# Patient Record
Sex: Female | Born: 1965 | Race: White | Hispanic: No | Smoking: Former smoker
Health system: Southern US, Community
[De-identification: ages and names within clinical notes are randomized; demographics above are authoritative.]

## PROBLEM LIST (undated history)

## (undated) DIAGNOSIS — R112 Nausea with vomiting, unspecified: Secondary | ICD-10-CM

## (undated) DIAGNOSIS — K5090 Crohn's disease, unspecified, without complications: Secondary | ICD-10-CM

## (undated) DIAGNOSIS — E785 Hyperlipidemia, unspecified: Secondary | ICD-10-CM

## (undated) DIAGNOSIS — M719 Bursopathy, unspecified: Secondary | ICD-10-CM

## (undated) DIAGNOSIS — K219 Gastro-esophageal reflux disease without esophagitis: Secondary | ICD-10-CM

## (undated) DIAGNOSIS — E538 Deficiency of other specified B group vitamins: Secondary | ICD-10-CM

## (undated) DIAGNOSIS — T8859XA Other complications of anesthesia, initial encounter: Secondary | ICD-10-CM

## (undated) DIAGNOSIS — E039 Hypothyroidism, unspecified: Secondary | ICD-10-CM

## (undated) DIAGNOSIS — T4145XA Adverse effect of unspecified anesthetic, initial encounter: Secondary | ICD-10-CM

## (undated) DIAGNOSIS — Z973 Presence of spectacles and contact lenses: Secondary | ICD-10-CM

## (undated) DIAGNOSIS — C801 Malignant (primary) neoplasm, unspecified: Secondary | ICD-10-CM

## (undated) DIAGNOSIS — M1990 Unspecified osteoarthritis, unspecified site: Secondary | ICD-10-CM

## (undated) DIAGNOSIS — B009 Herpesviral infection, unspecified: Secondary | ICD-10-CM

## (undated) DIAGNOSIS — D649 Anemia, unspecified: Secondary | ICD-10-CM

## (undated) DIAGNOSIS — F419 Anxiety disorder, unspecified: Secondary | ICD-10-CM

## (undated) DIAGNOSIS — G4730 Sleep apnea, unspecified: Secondary | ICD-10-CM

## (undated) DIAGNOSIS — Z9889 Other specified postprocedural states: Secondary | ICD-10-CM

## (undated) DIAGNOSIS — N809 Endometriosis, unspecified: Secondary | ICD-10-CM

## (undated) DIAGNOSIS — F1010 Alcohol abuse, uncomplicated: Secondary | ICD-10-CM

## (undated) HISTORY — PX: ENDOMETRIAL BIOPSY: SHX622

## (undated) HISTORY — PX: TONSILLECTOMY: SUR1361

## (undated) HISTORY — PX: CYSTECTOMY: SUR359

## (undated) HISTORY — PX: EXPLORATORY LAPAROTOMY: SUR591

## (undated) HISTORY — PX: UPPER GI ENDOSCOPY: SHX6162

## (undated) HISTORY — PX: CERVICAL BIOPSY  W/ LOOP ELECTRODE EXCISION: SUR135

## (undated) HISTORY — PX: KNEE ARTHROSCOPY: SUR90

## (undated) HISTORY — PX: DILATION AND CURETTAGE OF UTERUS: SHX78

---

## 2011-06-06 HISTORY — PX: ESSURE TUBAL LIGATION: SUR464

## 2018-06-10 ENCOUNTER — Other Ambulatory Visit: Payer: Self-pay | Admitting: Otolaryngology

## 2018-07-02 NOTE — Pre-Procedure Instructions (Signed)
Tonya Chambers  07/02/2018      [facility] [phone] - [place], [place] AT [place] [address] [place] [address] Phone: [phone] Fax: [phone]    Your procedure is scheduled on Wednesday February 5th.  Report to [facility] Admitting at 8:30 A.M.  Call this number if you have problems the morning of surgery:  [phone]   Remember:  Do not eat or drink after midnight.      Take these medicines the morning of surgery with A SIP OF WATER  acetaminophen (TYLENOL)-if needed DULoxetine (CYMBALTA) levothyroxine (SYNTHROID, LEVOTHROID) omeprazole (PRILOSEC) Polyethyl Glycol-Propyl Glycol (LUBRICANT EYE DROPS)   7 days prior to surgery STOP taking any Aspirin(unless otherwise instructed by your surgeon), Aleve, Naproxen, Ibuprofen, Motrin, Advil, Goody's, BC's, all herbal medications, fish oil, and all vitamins   Do not wear jewelry, make-up or nail polish.  Do not wear lotions, powders, or perfumes, or deodorant.  Do not shave 48 hours prior to surgery.   Do not bring valuables to the hospital.  [facility] is not responsible for any belongings or valuables.  Contacts, dentures or bridgework may not be worn into surgery.  Leave your suitcase in the car.  After surgery it may be brought to your room.  For patients admitted to the hospital, discharge time will be determined by your treatment team.  Patients discharged the day of surgery will not be allowed to drive home.   [facility]- Preparing For Surgery  Before surgery, you can play an important role. Because skin is not sterile, your skin needs to be as free of germs as possible. You can reduce the number of germs on your skin by washing with CHG (chlorahexidine gluconate) Soap before surgery.  CHG is an antiseptic cleaner which kills germs and bonds with the skin to continue killing germs even after washing.    Oral Hygiene is  also important to reduce your risk of infection.  Remember - BRUSH YOUR TEETH THE MORNING OF SURGERY WITH YOUR REGULAR TOOTHPASTE  Please do not use if you have an allergy to CHG or antibacterial soaps. If your skin becomes reddened/irritated stop using the CHG.  Do not shave (including legs and underarms) for at least 48 hours prior to first CHG shower. It is OK to shave your face.  Please follow these instructions carefully.   1. Shower the NIGHT BEFORE SURGERY and the MORNING OF SURGERY with CHG.   2. If you chose to wash your hair, wash your hair first as usual with your normal shampoo.  3. After you shampoo, rinse your hair and body thoroughly to remove the shampoo.  4. Use CHG as you would any other liquid soap. You can apply CHG directly to the skin and wash gently with a scrungie or a clean washcloth.   5. Apply the CHG Soap to your body ONLY FROM THE NECK DOWN.  Do not use on open wounds or open sores. Avoid contact with your eyes, ears, mouth and genitals (private parts). Wash Face and genitals (private parts)  with your normal soap.  6. Wash thoroughly, paying special attention to the area where your surgery will be performed.  7. Thoroughly rinse your body with warm water from the neck down.  8. DO NOT shower/wash with your normal soap after using and rinsing off the CHG Soap.  9. Pat yourself dry with a CLEAN TOWEL.  10.  Wear CLEAN PAJAMAS to bed the night before surgery, wear comfortable clothes the morning of surgery  11. Place CLEAN SHEETS on your bed the night of your first shower and DO NOT SLEEP WITH PETS.    Day of Surgery: Shower as stated above. Do not apply any deodorants/lotions.  Please wear clean clothes to the hospital/surgery center.   Remember to brush your teeth WITH YOUR REGULAR TOOTHPASTE.   Please read over the following fact sheets that you were given.

## 2018-07-03 ENCOUNTER — Encounter (HOSPITAL_COMMUNITY): Payer: Self-pay

## 2018-07-03 ENCOUNTER — Encounter (HOSPITAL_COMMUNITY)
Admission: RE | Admit: 2018-07-03 | Discharge: 2018-07-03 | Disposition: A | Discharge status: Home / Self Care | Payer: BLUE CROSS/BLUE SHIELD | Source: Ambulatory Visit | Attending: Otolaryngology | Admitting: Otolaryngology

## 2018-07-03 ENCOUNTER — Other Ambulatory Visit: Payer: Self-pay

## 2018-07-03 DIAGNOSIS — Z01812 Encounter for preprocedural laboratory examination: Secondary | ICD-10-CM | POA: Diagnosis present

## 2018-07-03 HISTORY — DX: Deficiency of other specified B group vitamins: E53.8

## 2018-07-03 HISTORY — DX: Sleep apnea, unspecified: G47.30

## 2018-07-03 HISTORY — DX: Other complications of anesthesia, initial encounter: T88.59XA

## 2018-07-03 HISTORY — DX: Alcohol abuse, uncomplicated: F10.10

## 2018-07-03 HISTORY — DX: Endometriosis, unspecified: N80.9

## 2018-07-03 HISTORY — DX: Anemia, unspecified: D64.9

## 2018-07-03 HISTORY — DX: Other specified postprocedural states: Z98.890

## 2018-07-03 HISTORY — DX: Anxiety disorder, unspecified: F41.9

## 2018-07-03 HISTORY — DX: Hypothyroidism, unspecified: E03.9

## 2018-07-03 HISTORY — DX: Malignant (primary) neoplasm, unspecified: C80.1

## 2018-07-03 HISTORY — DX: Other specified postprocedural states: R11.2

## 2018-07-03 HISTORY — DX: Hyperlipidemia, unspecified: E78.5

## 2018-07-03 HISTORY — DX: Crohn's disease, unspecified, without complications: K50.90

## 2018-07-03 HISTORY — DX: Herpesviral infection, unspecified: B00.9

## 2018-07-03 HISTORY — DX: Adverse effect of unspecified anesthetic, initial encounter: T41.45XA

## 2018-07-03 HISTORY — DX: Gastro-esophageal reflux disease without esophagitis: K21.9

## 2018-07-03 HISTORY — DX: Unspecified osteoarthritis, unspecified site: M19.90

## 2018-07-03 HISTORY — DX: Bursopathy, unspecified: M71.9

## 2018-07-03 LAB — COMPREHENSIVE METABOLIC PANEL
ALT: 20 U/L (ref 0–44)
AST: 24 U/L (ref 15–41)
Albumin: 4.2 g/dL (ref 3.5–5.0)
Alkaline Phosphatase: 53 U/L (ref 38–126)
Anion gap: 12 (ref 5–15)
BUN: 8 mg/dL (ref 6–20)
CO2: 23 mmol/L (ref 22–32)
Calcium: 9.2 mg/dL (ref 8.9–10.3)
Chloride: 104 mmol/L (ref 98–111)
Creatinine, Ser: 0.64 mg/dL (ref 0.44–1.00)
GFR calc Af Amer: >60 mL/min (ref >60)
GFR calc non Af Amer: >60 mL/min (ref >60)
Glucose, Bld: 107 mg/dL — ABNORMAL HIGH (ref 70–99)
Potassium: 4.3 mmol/L (ref 3.5–5.1)
Sodium: 139 mmol/L (ref 135–145)
Total Bilirubin: 0.6 mg/dL (ref 0.3–1.2)
Total Protein: 7.1 g/dL (ref 6.5–8.1)

## 2018-07-03 LAB — CBC
HCT: 40.5 % (ref 36.0–46.0)
HEMOGLOBIN: 13.6 g/dL (ref 12.0–15.0)
MCH: 30 pg (ref 26.0–34.0)
MCHC: 33.6 g/dL (ref 30.0–36.0)
MCV: 89.2 fL (ref 80.0–100.0)
Platelets: 471 10*3/uL — ABNORMAL HIGH (ref 150–400)
RBC: 4.54 MIL/uL (ref 3.87–5.11)
RDW: 14 % (ref 11.5–15.5)
WBC: 12.1 10*3/uL — ABNORMAL HIGH (ref 4.0–10.5)
nRBC: 0 % (ref 0.0–0.2)

## 2018-07-03 NOTE — Progress Notes (Signed)
PCP - Wayland Salinas, [facility] Cardiologist - denies  Chest x-ray -N/A EKG - N/A Stress Test - denies ECHO - denies Cardiac Cath - denies  Sleep Study - diagnosed with OSA CPAP - has CPAP but does not use   Aspirin Instructions: Patient instructed to hold all Aspirin, NSAID's, herbal medications, fish oil and vitamins 7 days prior to surgery.   Anesthesia review:   Patient denies shortness of breath, fever, cough and chest pain at PAT appointment   Patient verbalized understanding of instructions that were given to them at the PAT appointment. Patient was also instructed that they will need to review over the PAT instructions again at home before surgery.

## 2018-08-20 ENCOUNTER — Encounter (HOSPITAL_COMMUNITY): Payer: Self-pay | Admitting: *Deleted

## 2018-08-20 ENCOUNTER — Other Ambulatory Visit: Payer: Self-pay

## 2018-08-20 NOTE — Progress Notes (Signed)
Spoke with patient regarding her pre-op instructions.  Patient denies chest pain, SOB or DM.  Patient had a pre-op appt in 06/2018 and still had CHG soap at home.  Patient will shower with CHG soap tonight and tomorrow morning.

## 2018-08-21 ENCOUNTER — Ambulatory Visit (HOSPITAL_COMMUNITY): Payer: BLUE CROSS/BLUE SHIELD | Admitting: Anesthesiology

## 2018-08-21 ENCOUNTER — Ambulatory Visit (HOSPITAL_COMMUNITY)
Admission: RE | Admit: 2018-08-21 | Discharge: 2018-08-21 | Disposition: A | Discharge status: Home / Self Care | Payer: BLUE CROSS/BLUE SHIELD | Attending: Otolaryngology | Admitting: Otolaryngology

## 2018-08-21 ENCOUNTER — Encounter (HOSPITAL_COMMUNITY): Payer: Self-pay

## 2018-08-21 ENCOUNTER — Encounter (HOSPITAL_COMMUNITY)
Admission: RE | Disposition: A | Discharge status: Home / Self Care | Payer: Self-pay | Source: Home / Self Care | Attending: Otolaryngology

## 2018-08-21 ENCOUNTER — Other Ambulatory Visit: Payer: Self-pay

## 2018-08-21 DIAGNOSIS — E039 Hypothyroidism, unspecified: Secondary | ICD-10-CM | POA: Insufficient documentation

## 2018-08-21 DIAGNOSIS — M1990 Unspecified osteoarthritis, unspecified site: Secondary | ICD-10-CM | POA: Diagnosis not present

## 2018-08-21 DIAGNOSIS — F419 Anxiety disorder, unspecified: Secondary | ICD-10-CM | POA: Insufficient documentation

## 2018-08-21 DIAGNOSIS — J342 Deviated nasal septum: Secondary | ICD-10-CM | POA: Insufficient documentation

## 2018-08-21 DIAGNOSIS — E785 Hyperlipidemia, unspecified: Secondary | ICD-10-CM | POA: Diagnosis not present

## 2018-08-21 DIAGNOSIS — Z79899 Other long term (current) drug therapy: Secondary | ICD-10-CM | POA: Diagnosis not present

## 2018-08-21 DIAGNOSIS — Z87891 Personal history of nicotine dependence: Secondary | ICD-10-CM | POA: Insufficient documentation

## 2018-08-21 DIAGNOSIS — K5090 Crohn's disease, unspecified, without complications: Secondary | ICD-10-CM | POA: Diagnosis not present

## 2018-08-21 DIAGNOSIS — K219 Gastro-esophageal reflux disease without esophagitis: Secondary | ICD-10-CM | POA: Diagnosis not present

## 2018-08-21 DIAGNOSIS — G4733 Obstructive sleep apnea (adult) (pediatric): Secondary | ICD-10-CM | POA: Insufficient documentation

## 2018-08-21 DIAGNOSIS — J343 Hypertrophy of nasal turbinates: Secondary | ICD-10-CM | POA: Diagnosis not present

## 2018-08-21 HISTORY — PX: NASAL SEPTOPLASTY W/ TURBINOPLASTY: SHX2070

## 2018-08-21 HISTORY — PX: DRUG INDUCED ENDOSCOPY: SHX6808

## 2018-08-21 HISTORY — DX: Presence of spectacles and contact lenses: Z97.3

## 2018-08-21 LAB — COMPREHENSIVE METABOLIC PANEL
ALBUMIN: 3.6 g/dL (ref 3.5–5.0)
ALT: 15 U/L (ref 0–44)
AST: 16 U/L (ref 15–41)
Alkaline Phosphatase: 49 U/L (ref 38–126)
Anion gap: 7 (ref 5–15)
BUN: 11 mg/dL (ref 6–20)
CO2: 22 mmol/L (ref 22–32)
Calcium: 8.5 mg/dL — ABNORMAL LOW (ref 8.9–10.3)
Chloride: 109 mmol/L (ref 98–111)
Creatinine, Ser: 0.56 mg/dL (ref 0.44–1.00)
GFR calc Af Amer: >60 mL/min (ref >60)
GFR calc non Af Amer: >60 mL/min (ref >60)
Glucose, Bld: 98 mg/dL (ref 70–99)
Potassium: 3.5 mmol/L (ref 3.5–5.1)
Sodium: 138 mmol/L (ref 135–145)
Total Bilirubin: 0.7 mg/dL (ref 0.3–1.2)
Total Protein: 5.8 g/dL — ABNORMAL LOW (ref 6.5–8.1)

## 2018-08-21 LAB — CBC
HCT: 36.5 % (ref 36.0–46.0)
Hemoglobin: 11.9 g/dL — ABNORMAL LOW (ref 12.0–15.0)
MCH: 28.8 pg (ref 26.0–34.0)
MCHC: 32.6 g/dL (ref 30.0–36.0)
MCV: 88.4 fL (ref 80.0–100.0)
Platelets: 257 10*3/uL (ref 150–400)
RBC: 4.13 MIL/uL (ref 3.87–5.11)
RDW: 14.4 % (ref 11.5–15.5)
WBC: 9.6 10*3/uL (ref 4.0–10.5)
nRBC: 0 % (ref 0.0–0.2)

## 2018-08-21 SURGERY — SEPTOPLASTY, NOSE, WITH NASAL TURBINATE REDUCTION
Anesthesia: General | Site: Throat

## 2018-08-21 MED ORDER — PROPOFOL 500 MG/50ML IV EMUL
INTRAVENOUS | Status: DC | PRN
  Administered 2018-08-21: 50 ug/kg/min via INTRAVENOUS

## 2018-08-21 MED ORDER — MUPIROCIN CALCIUM 2 % EX CREA
TOPICAL_CREAM | CUTANEOUS | Status: AC
  Filled 2018-08-21: qty 15

## 2018-08-21 MED ORDER — SUGAMMADEX SODIUM 200 MG/2ML IV SOLN
INTRAVENOUS | Status: DC | PRN
  Administered 2018-08-21: 200 mg via INTRAVENOUS

## 2018-08-21 MED ORDER — OXYCODONE HCL 5 MG PO TABS: 5.0000 mg | ORAL_TABLET | Freq: Once | ORAL | Status: DC | PRN

## 2018-08-21 MED ORDER — FENTANYL CITRATE (PF) 250 MCG/5ML IJ SOLN
INTRAMUSCULAR | Status: DC | PRN
  Administered 2018-08-21 (×2): 50 ug via INTRAVENOUS

## 2018-08-21 MED ORDER — FENTANYL CITRATE (PF) 250 MCG/5ML IJ SOLN
INTRAMUSCULAR | Status: AC
  Filled 2018-08-21: qty 5

## 2018-08-21 MED ORDER — LIDOCAINE-EPINEPHRINE 1 %-1:100000 IJ SOLN
INTRAMUSCULAR | Status: AC
  Filled 2018-08-21: qty 1

## 2018-08-21 MED ORDER — ONDANSETRON HCL 4 MG/2ML IJ SOLN
INTRAMUSCULAR | Status: DC | PRN
  Administered 2018-08-21: 4 mg via INTRAVENOUS

## 2018-08-21 MED ORDER — SODIUM CHLORIDE 0.9 % IV SOLN
INTRAVENOUS | Status: DC | PRN
  Administered 2018-08-21: 25 ug/min via INTRAVENOUS

## 2018-08-21 MED ORDER — CEPHALEXIN 500 MG PO CAPS: 500.0000 mg | ORAL_CAPSULE | Freq: Three times a day (TID) | ORAL | 1 refills | Status: AC

## 2018-08-21 MED ORDER — CHLORHEXIDINE GLUCONATE CLOTH 2 % EX PADS: 6.0000 | MEDICATED_PAD | Freq: Once | CUTANEOUS | Status: DC

## 2018-08-21 MED ORDER — ROCURONIUM BROMIDE 10 MG/ML (PF) SYRINGE
PREFILLED_SYRINGE | INTRAVENOUS | Status: DC | PRN
  Administered 2018-08-21: 50 mg via INTRAVENOUS

## 2018-08-21 MED ORDER — DEXAMETHASONE SODIUM PHOSPHATE 10 MG/ML IJ SOLN
10.0000 mg | Freq: Once | INTRAMUSCULAR | Status: AC
  Administered 2018-08-21: 10 mg via INTRAVENOUS
  Filled 2018-08-21: qty 1

## 2018-08-21 MED ORDER — FENTANYL CITRATE (PF) 100 MCG/2ML IJ SOLN: 25.0000 ug | INTRAMUSCULAR | Status: DC | PRN

## 2018-08-21 MED ORDER — SCOPOLAMINE 1 MG/3DAYS TD PT72
MEDICATED_PATCH | TRANSDERMAL | Status: AC
  Filled 2018-08-21: qty 1

## 2018-08-21 MED ORDER — LACTATED RINGERS IV SOLN
INTRAVENOUS | Status: DC
  Administered 2018-08-21: 10:00:00 via INTRAVENOUS

## 2018-08-21 MED ORDER — LIDOCAINE 2% (20 MG/ML) 5 ML SYRINGE
INTRAMUSCULAR | Status: DC | PRN
  Administered 2018-08-21: 40 mg via INTRAVENOUS

## 2018-08-21 MED ORDER — PROPOFOL 10 MG/ML IV BOLUS
INTRAVENOUS | Status: AC
  Filled 2018-08-21: qty 40

## 2018-08-21 MED ORDER — OXYMETAZOLINE HCL 0.05 % NA SOLN
NASAL | Status: AC
  Filled 2018-08-21: qty 30

## 2018-08-21 MED ORDER — 0.9 % SODIUM CHLORIDE (POUR BTL) OPTIME
TOPICAL | Status: DC | PRN
  Administered 2018-08-21: 1000 mL

## 2018-08-21 MED ORDER — CEFAZOLIN SODIUM-DEXTROSE 2-4 GM/100ML-% IV SOLN
2.0000 g | INTRAVENOUS | Status: AC
  Administered 2018-08-21: 2 g via INTRAVENOUS
  Filled 2018-08-21: qty 100

## 2018-08-21 MED ORDER — MUPIROCIN 2 % EX OINT
TOPICAL_OINTMENT | CUTANEOUS | Status: DC | PRN
  Administered 2018-08-21: 1 via NASAL

## 2018-08-21 MED ORDER — MIDAZOLAM HCL 2 MG/2ML IJ SOLN
INTRAMUSCULAR | Status: AC
  Filled 2018-08-21: qty 2

## 2018-08-21 MED ORDER — ONDANSETRON HCL 4 MG/2ML IJ SOLN: 4.0000 mg | Freq: Once | INTRAMUSCULAR | Status: DC | PRN

## 2018-08-21 MED ORDER — LIDOCAINE-EPINEPHRINE 1 %-1:100000 IJ SOLN
INTRAMUSCULAR | Status: DC | PRN
  Administered 2018-08-21: 6 mL

## 2018-08-21 MED ORDER — OXYMETAZOLINE HCL 0.05 % NA SOLN
1.0000 | Freq: Two times a day (BID) | NASAL | Status: DC
  Administered 2018-08-21: 2 via NASAL
  Filled 2018-08-21: qty 30

## 2018-08-21 MED ORDER — OXYMETAZOLINE HCL 0.05 % NA SOLN
NASAL | Status: DC | PRN
  Administered 2018-08-21: 1

## 2018-08-21 MED ORDER — MUPIROCIN 2 % EX OINT
TOPICAL_OINTMENT | CUTANEOUS | Status: AC
  Filled 2018-08-21: qty 22

## 2018-08-21 MED ORDER — PROPOFOL 10 MG/ML IV BOLUS
INTRAVENOUS | Status: DC | PRN
  Administered 2018-08-21: 20 mg via INTRAVENOUS
  Administered 2018-08-21: 160 mg via INTRAVENOUS
  Administered 2018-08-21: 20 mg via INTRAVENOUS

## 2018-08-21 MED ORDER — MIDAZOLAM HCL 2 MG/2ML IJ SOLN
INTRAMUSCULAR | Status: DC | PRN
  Administered 2018-08-21: 2 mg via INTRAVENOUS

## 2018-08-21 MED ORDER — OXYCODONE HCL 5 MG/5ML PO SOLN: 5.0000 mg | Freq: Once | ORAL | Status: DC | PRN

## 2018-08-21 SURGICAL SUPPLY — 35 items
BLADE SURG 15 STRL LF DISP TIS (BLADE) ×2 IMPLANT
BLADE SURG 15 STRL SS (BLADE) ×2
CANISTER SUCT 1200ML W/VALVE (MISCELLANEOUS) ×4 IMPLANT
CANISTER SUCT 3000ML PPV (MISCELLANEOUS) ×4 IMPLANT
COAGULATOR SUCT 8FR VV (MISCELLANEOUS) ×4 IMPLANT
COVER WAND RF STERILE (DRAPES) ×4 IMPLANT
DRAPE HALF SHEET 40X57 (DRAPES) IMPLANT
ELECT REM PT RETURN 9FT ADLT (ELECTROSURGICAL) ×4
ELECTRODE REM PT RTRN 9FT ADLT (ELECTROSURGICAL) ×2 IMPLANT
GAUZE SPONGE 2X2 8PLY STRL LF (GAUZE/BANDAGES/DRESSINGS) ×2 IMPLANT
GLOVE BIOGEL M 7.0 STRL (GLOVE) ×8 IMPLANT
GOWN STRL REUS W/ TWL LRG LVL3 (GOWN DISPOSABLE) ×4 IMPLANT
GOWN STRL REUS W/TWL LRG LVL3 (GOWN DISPOSABLE) ×4
KIT BASIN OR (CUSTOM PROCEDURE TRAY) ×4 IMPLANT
KIT TURNOVER KIT B (KITS) ×4 IMPLANT
NEEDLE HYPO 25GX1X1/2 BEV (NEEDLE) ×4 IMPLANT
NEEDLE PRECISIONGLIDE 27X1.5 (NEEDLE) IMPLANT
NS IRRIG 1000ML POUR BTL (IV SOLUTION) ×4 IMPLANT
PAD ARMBOARD 7.5X6 YLW CONV (MISCELLANEOUS) ×4 IMPLANT
PATTIES SURGICAL .5 X3 (DISPOSABLE) IMPLANT
SHEET MEDIUM DRAPE 40X70 STRL (DRAPES) ×4 IMPLANT
SOLUTION ANTI FOG 6CC (MISCELLANEOUS) ×4 IMPLANT
SPLINT NASAL DOYLE BI-VL (GAUZE/BANDAGES/DRESSINGS) ×8 IMPLANT
SPONGE GAUZE 2X2 STER 10/PKG (GAUZE/BANDAGES/DRESSINGS) ×2
SPONGE NEURO XRAY DETECT 1X3 (DISPOSABLE) ×4 IMPLANT
SUT ETHILON 3 0 PS 1 (SUTURE) ×4 IMPLANT
SUT PLAIN 4 0 ~~LOC~~ 1 (SUTURE) ×4 IMPLANT
SYR CONTROL 10ML LL (SYRINGE) IMPLANT
TOWEL GREEN STERILE FF (TOWEL DISPOSABLE) ×4 IMPLANT
TOWEL OR 17X24 6PK STRL BLUE (TOWEL DISPOSABLE) ×4 IMPLANT
TRAY ENT MC OR (CUSTOM PROCEDURE TRAY) ×4 IMPLANT
TUBE CONNECTING 20'X1/4 (TUBING)
TUBE CONNECTING 20X1/4 (TUBING) IMPLANT
TUBE SALEM SUMP 16 FR W/ARV (TUBING) ×4 IMPLANT
TUBING EXTENTION W/L.L. (IV SETS) ×4 IMPLANT

## 2018-08-21 NOTE — H&P (Signed)
Tonya Chambers is an 53 y.o. female.   Chief Complaint:  Obstructive sleep apnea HPI: Nasal obstruction and OSA  Past Medical History:  Diagnosis Date  . Anemia   . Anxiety   . Arthritis    wrist, hands, knees  . B12 deficiency   . Bursitis   . Cancer (HCC)    Hyperplasia of uterus  . Complication of anesthesia   . Crohn's disease ([place])   . Endometriosis   . ETOH abuse   . GERD (gastroesophageal reflux disease)   . HSV-2 infection   . Hyperlipemia    diet controlled  . Hypothyroidism   . PONV (postoperative nausea and vomiting)   . Sleep apnea    does not use cpap  . Wears glasses     Past Surgical History:  Procedure Laterality Date  . CERVICAL BIOPSY  W/ LOOP ELECTRODE EXCISION    . CESAREAN SECTION     x 1  . CYSTECTOMY     cyst over eye removed as child  . DILATION AND CURETTAGE OF UTERUS    . ENDOMETRIAL BIOPSY    . ESSURE TUBAL LIGATION  2013  . EXPLORATORY LAPAROTOMY    . KNEE ARTHROSCOPY Right   . TONSILLECTOMY    . UPPER GI ENDOSCOPY      History reviewed. No pertinent family history. Social History:  reports that she quit smoking about 17 years ago. Her smoking use included cigarettes. She has a 20.00 pack-year smoking history. She has never used smokeless tobacco. She reports current alcohol use of about 14.0 - 21.0 standard drinks of alcohol per week. She reports that she does not use drugs.  Allergies:  Allergies  Allergen Reactions  . Nsaids Other (See Comments)    Patient has crohn's disease    Medications Prior to Admission  Medication Sig Dispense Refill  . acetaminophen (TYLENOL) 500 MG tablet Take 1,000 mg by mouth every 6 (six) hours as needed (pain.).    Marland Kitchen CANNABIDIOL PO Take 1 drop by mouth daily. CBD OIL 1 DROPPERFUL    . docusate sodium (COLACE) 250 MG capsule Take 250 mg by mouth at bedtime.    . DULoxetine (CYMBALTA) 30 MG capsule Take 30 mg by mouth 2 (two) times daily.    Marland Kitchen estrogen-methylTESTOSTERone 0.625-1.25 MG tablet  Take 1 tablet by mouth at bedtime.    Marland Kitchen levothyroxine (SYNTHROID, LEVOTHROID) 50 MCG tablet Take 50 mcg by mouth daily before breakfast.    . Multiple Vitamin (MULTIVITAMIN WITH MINERALS) TABS tablet Take 1 tablet by mouth daily.    . Omega-3 Fatty Acids (FISH OIL) 1000 MG CAPS Take 1,000 mg by mouth daily.    Marland Kitchen omeprazole (PRILOSEC) 40 MG capsule Take 40 mg by mouth daily.    Vladimir Faster Glycol-Propyl Glycol (LUBRICANT EYE DROPS) 0.4-0.3 % SOLN Place 1 drop into both eyes 3 (three) times daily as needed (itchy/red eyes.).    Marland Kitchen progesterone (PROMETRIUM) 200 MG capsule Take 200 mg by mouth at bedtime.      Results for orders placed or performed during the hospital encounter of 08/21/18 (from the past 48 hour(s))  Comprehensive metabolic panel     Status: Abnormal   Collection Time: 08/21/18  7:51 AM  Result Value Ref Range   Sodium 138 135 - 145 mmol/L   Potassium 3.5 3.5 - 5.1 mmol/L   Chloride 109 98 - 111 mmol/L   CO2 22 22 - 32 mmol/L   Glucose, Bld 98 70 - 99  mg/dL   BUN 11 6 - 20 mg/dL   Creatinine, Ser 0.56 0.44 - 1.00 mg/dL   Calcium 8.5 (L) 8.9 - 10.3 mg/dL   Total Protein 5.8 (L) 6.5 - 8.1 g/dL   Albumin 3.6 3.5 - 5.0 g/dL   AST 16 15 - 41 U/L   ALT 15 0 - 44 U/L   Alkaline Phosphatase 49 38 - 126 U/L   Total Bilirubin 0.7 0.3 - 1.2 mg/dL   GFR calc non Af Amer >60 >60 mL/min   GFR calc Af Amer >60 >60 mL/min   Anion gap 7 5 - 15    Comment: Performed at [facility] [address], [place], [place] [address]  CBC     Status: Abnormal   Collection Time: 08/21/18  7:51 AM  Result Value Ref Range   WBC 9.6 4.0 - 10.5 K/uL   RBC 4.13 3.87 - 5.11 MIL/uL   Hemoglobin 11.9 (L) 12.0 - 15.0 g/dL   HCT 36.5 36.0 - 46.0 %   MCV 88.4 80.0 - 100.0 fL   MCH 28.8 26.0 - 34.0 pg   MCHC 32.6 30.0 - 36.0 g/dL   RDW 14.4 11.5 - 15.5 %   Platelets 257 150 - 400 K/uL   nRBC 0.0 0.0 - 0.2 %    Comment: Performed at [facility], [place] [address] [place], [place] [address]    No results found.  Review of Systems  Constitutional: Negative.   HENT: Negative.   Respiratory: Negative.   Cardiovascular: Negative.     Blood pressure 127/76, pulse 65, temperature 98.7 F (37.1 C), temperature source Oral, resp. rate 18, height 5\' 6"  (1.676 m), weight 74.4 kg, SpO2 96 %. Physical Exam  Constitutional: She appears well-developed and well-nourished.  HENT:  Deviated septum  Neck: Normal range of motion. Neck supple.  Cardiovascular: Normal rate.  Respiratory: Effort normal.     Assessment/Plan Adm for OP DISE, septoplasty and IT reduction  Jerrell Belfast, MD 08/21/2018, 9:44 AM

## 2018-08-21 NOTE — Op Note (Signed)
Operative Note:  SEPTOPLASTY AND INFERIOR TURBINATE REDUCTION    DRUG INDUCED SLEEP ENDOSCOPY  Patient: Tonya Chambers  Medical record number: [id number]  Date:08/21/2018  Pre-operative Indications: 1. Deviated nasal septum with nasal airway obstruction     2.  Bilateral inferior turbinate hypertrophy     3.  Obstructive sleep apnea  Postoperative Indications: Same  Surgical Procedure: 1.  Nasal Septoplasty    2.  Bilateral Inferior Turbinate Reduction    3.  Drug-induced sleep endoscopy  Anesthesia: GET  Surgeon: Delsa Bern, M.D.  Complications: None  EBL: 50 cc  Findings: Severely deviated nasal septum with airway obstruction and bilateral inferior turbinate hypertrophy. During the drug-induced sleep endoscopy there was no evidence of complete concentric palatal obstruction and the patient appears to be a candidate for hypoglossal nerve stimulation therapy in the future.   Brief History: The patient is a 53 y.o. female with a history of progressive nasal airway obstruction. The patient has been on medical therapy to reduce nasal mucosal edema including saline nasal spray and topical nasal steroids. Despite appropriate medical therapy the patient continues to have ongoing symptoms. Given the patient's history and findings, the above surgical procedures were recommended, risks and benefits were discussed in detail with the patient may understand and agree with our plan for surgery which is scheduled at [facility] under general anesthesia as an outpatient.  Surgical Procedure: The patient is brought to the operating room on 08/21/2018 and placed in supine position on the operating table.    A propofol infusion was administered and the patient was monitored carefully to achieve a level of sedation appropriate for DISE.  The patient did not respond to verbal commands but still had spontaneous respiration, sleep disordered breathing and associated desaturations were  observed.  With the patient under adequate sedated anesthesia the flexible nasal laryngoscope was passed without difficulty.  The patient's nasal cavity showed severe septal deviation with bilateral nasal airway obstruction and turbinate hypertrophy.  The endoscope was then passed to visualize the velopharynx, oropharynx, tongue base and epiglottis to assess areas of obstruction.  Patient's airway showed no evidence of mass, lesion or abnormal tissue.   There was no evidence of complete concentric palatal obstruction and the patient appeared to be a candidate anatomically for hypoglossal nerve stimulation therapy.  General endotracheal anesthesia was established without difficulty. When the patient was adequately anesthetized, surgical timeout was performed and correct identification of the patient and the surgical procedure. The patient's nose was then injected with 6 cc of 1% lidocaine 1 100,000 dilution epinephrine which was injected in a submucosal fashion. The patient's nose was then packed with Afrin-soaked cottonoid pledgets were left in place for approximately 10 minutes lateral vasoconstriction and hemostasis.   With the patient prepped draped and prepared for surgery, nasal septoplasty was begun.  A left anterior hemitransfixion incision was created and a mucoperichondrial flap was elevated from anterior to posterior on the left-hand side. The anterior cartilaginous septum was crossed at the midline and a mucoperichondrial flap was elevated on the patient's right.  Swivel knife was then used to resect the anterior and mid cartilaginous portion of the nasal septum.  Resected cartilage was morcellized and returned to the mucoperichondrial pocket at the occlusion of the surgical procedure.  Dissection was then carried out from anterior to posterior removing deviated bone and cartilage including a large septal spur the overlying mucosa was preserved.  With the septum brought to good midline position, the  morselized cartilage was  returned to the mucoperichondrial pocket and the soft tissue/mucosal flaps were reapproximated with interrupted 4-0 gut suture on a Keith needle in a horizontal mattressing fashion.  Anterior hemitransfixion incision was closed with the same stitch.  Bilateral Doyle nasal septal splints were then placed after the application of Bactroban ointment and sutured in position with a 3-0 Ethilon suture.  Attention was then turned to the inferior turbinates, bilateral inferior turbinate intramural cautery was performed with cautery setting at 78 W.  2 submucosal passes were made in each inferior turbinate.  After completing cautery, anterior vertical incisions were created and overlying soft tissue was elevated, a small amount of turbinate bone was resected.  The turbinates were then outfractured to create a more patent nasal passageway.  Surgical sponge count was correct. An oral gastric tube was passed and the stomach contents were aspirated. Patient was awakened from anesthetic and transferred from the operating room to the recovery room in stable condition. There were no complications and blood loss was 50cc.   Delsa Bern, M.D. [facility] 08/21/2018

## 2018-08-21 NOTE — Anesthesia Postprocedure Evaluation (Signed)
Anesthesia Post Note  Patient: Tonya Chambers  Procedure(s) Performed: NASAL SEPTOPLASTY WITH TURBINATE REDUCTION (Bilateral Nose) DRUG INDUCED ENDOSCOPY (N/A Throat)     Patient location during evaluation: PACU Anesthesia Type: General Level of consciousness: awake and alert Pain management: pain level controlled Vital Signs Assessment: post-procedure vital signs reviewed and stable Respiratory status: spontaneous breathing, nonlabored ventilation, respiratory function stable and patient connected to nasal cannula oxygen Cardiovascular status: blood pressure returned to baseline and stable Postop Assessment: no apparent nausea or vomiting Anesthetic complications: no    Last Vitals:  Vitals:   08/21/18 1200 08/21/18 1226  BP: (!) 157/92 136/88  Pulse: 70 71  Resp: 13   Temp: (!) 36.1 C   SpO2: 97% 97%    Last Pain:  Vitals:   08/21/18 1226  TempSrc:   PainSc: 0-No pain                 [name]

## 2018-08-21 NOTE — Transfer of Care (Signed)
Immediate Anesthesia Transfer of Care Note  Patient: Tonya Chambers  Procedure(s) Performed: NASAL SEPTOPLASTY WITH TURBINATE REDUCTION (Bilateral Nose) DRUG INDUCED ENDOSCOPY (N/A Throat)  Patient Location: PACU  Anesthesia Type:General  Level of Consciousness: awake and alert   Airway & Oxygen Therapy: Patient Spontanous Breathing and Patient connected to face mask oxygen  Post-op Assessment: Report given to RN and Post -op Vital signs reviewed and stable  Post vital signs: Reviewed and stable  Last Vitals:  Vitals Value Taken Time  BP 178/91 08/21/2018 11:16 AM  Temp 36.4 C 08/21/2018 11:15 AM  Pulse 67 08/21/2018 11:25 AM  Resp 12 08/21/2018 11:25 AM  SpO2 96 % 08/21/2018 11:25 AM  Vitals shown include unvalidated device data.  Last Pain:  Vitals:   08/21/18 1115  TempSrc:   PainSc: Asleep      Patients Stated Pain Goal: 3 (24/09/73 5329)  Complications: No apparent anesthesia complications

## 2018-08-21 NOTE — Anesthesia Procedure Notes (Signed)
Procedure Name: Intubation Date/Time: 08/21/2018 10:12 AM Performed by: Valda Favia, CRNA Pre-anesthesia Checklist: Patient identified, Emergency Drugs available, Suction available and Patient being monitored Patient Re-evaluated:Patient Re-evaluated prior to induction Oxygen Delivery Method: Circle System Utilized Preoxygenation: Pre-oxygenation with 100% oxygen Induction Type: IV induction Ventilation: Mask ventilation without difficulty and Oral airway inserted - appropriate to patient size Laryngoscope Size: Mac and 4 Grade View: Grade II Tube type: Oral Tube size: 7.0 mm Number of attempts: 1 Airway Equipment and Method: Stylet and Oral airway Placement Confirmation: ETT inserted through vocal cords under direct vision,  positive ETCO2 and breath sounds checked- equal and bilateral Secured at: 21 cm Tube secured with: Tape Dental Injury: Teeth and Oropharynx as per pre-operative assessment

## 2018-08-21 NOTE — Anesthesia Preprocedure Evaluation (Addendum)
Anesthesia Evaluation  Patient identified by MRN, date of birth, ID band Patient awake    Reviewed: Allergy & Precautions, NPO status , Patient's Chart, lab work & pertinent test results  Airway Mallampati: II  TM Distance: >3 FB Neck ROM: Full    Dental  (+) Teeth Intact, Dental Advisory Given   Pulmonary former smoker,    breath sounds clear to auscultation       Cardiovascular  Rhythm:Regular Rate:Normal     Neuro/Psych    GI/Hepatic   Endo/Other    Renal/GU      Musculoskeletal   Abdominal   Peds  Hematology   Anesthesia Other Findings   Reproductive/Obstetrics                            Anesthesia Physical Anesthesia Plan  ASA: II  Anesthesia Plan: General   Post-op Pain Management:    Induction: Intravenous  PONV Risk Score and Plan: Ondansetron, Dexamethasone and Scopolamine patch - Pre-op  Airway Management Planned: Oral ETT  Additional Equipment:   Intra-op Plan:   Post-operative Plan: Extubation in OR  Informed Consent: I have reviewed the patients History and Physical, chart, labs and discussed the procedure including the risks, benefits and alternatives for the proposed anesthesia with the patient or authorized representative who has indicated his/her understanding and acceptance.     Dental advisory given  Plan Discussed with: CRNA and Anesthesiologist  Anesthesia Plan Comments:         Anesthesia Quick Evaluation

## 2018-08-22 ENCOUNTER — Encounter (HOSPITAL_COMMUNITY): Payer: Self-pay | Admitting: Otolaryngology

## 2019-04-11 ENCOUNTER — Other Ambulatory Visit: Payer: Self-pay | Admitting: Otolaryngology

## 2019-05-12 NOTE — Pre-Procedure Instructions (Signed)
[facility] [phone] - [place], [place] [id number] [id number] Phone: [phone] Fax: [phone]      Your procedure is scheduled on Friday December 11th.  Report to [facility] Main Entrance "A" at 5:30 A.M., and check in at the Admitting office.  Call this number if you have problems the morning of surgery:  [phone]  Call [phone] if you have any questions prior to your surgery date Monday-Friday 8am-4pm    Remember:  Do not eat or drink after midnight the night before your surgery     Take these medicines the morning of surgery with A SIP OF WATER  DULoxetine (CYMBALTA)  estradiol (ESTRACE) levothyroxine (SYNTHROID, LEVOTHROID) omeprazole (PRILOSEC) acetaminophen (TYLENOL) if needed fluticasone (FLONASE) if needed     7 days prior to surgery STOP taking any Aspirin (unless otherwise instructed by your surgeon), Aleve, Naproxen, Ibuprofen, Motrin, Advil, Goody's, BC's, all herbal medications, fish oil, and all vitamins.    The Morning of Surgery  Do not wear jewelry, make-up or nail polish.  Do not wear lotions, powders, or perfumes/colognes, or deodorant  Do not shave 48 hours prior to surgery.  Men may shave face and neck.  Do not bring valuables to the hospital.  [facility] is not responsible for any belongings or valuables.  If you are a smoker, DO NOT Smoke 24 hours prior to surgery  If you wear a CPAP at night please bring your mask, tubing, and machine the morning of surgery   Remember that you must have someone to transport you home after your surgery, and remain with you for 24 hours if you are discharged the same day.   Please bring cases for contacts, glasses, hearing aids, dentures or bridgework because it cannot be worn into surgery.    Leave your suitcase in the car.  After surgery it may be brought to your room.  For patients admitted to the  hospital, discharge time will be determined by your treatment team.  Patients discharged the day of surgery will not be allowed to drive home.    Special instructions:   [facility]- Preparing For Surgery  Before surgery, you can play an important role. Because skin is not sterile, your skin needs to be as free of germs as possible. You can reduce the number of germs on your skin by washing with CHG (chlorahexidine gluconate) Soap before surgery.  CHG is an antiseptic cleaner which kills germs and bonds with the skin to continue killing germs even after washing.    Oral Hygiene is also important to reduce your risk of infection.  Remember - BRUSH YOUR TEETH THE MORNING OF SURGERY WITH YOUR REGULAR TOOTHPASTE  Please do not use if you have an allergy to CHG or antibacterial soaps. If your skin becomes reddened/irritated stop using the CHG.  Do not shave (including legs and underarms) for at least 48 hours prior to first CHG shower. It is OK to shave your face.  Please follow these instructions carefully.   1. Shower the NIGHT BEFORE SURGERY and the MORNING OF SURGERY with CHG Soap.   2. If you chose to wash your hair, wash your hair first as usual with your normal shampoo.  3. After you shampoo, rinse your hair and body thoroughly to remove the shampoo.  4. Use CHG as you would any other liquid soap. You can apply CHG directly to the  skin and wash gently with a scrungie or a clean washcloth.   5. Apply the CHG Soap to your body ONLY FROM THE NECK DOWN.  Do not use on open wounds or open sores. Avoid contact with your eyes, ears, mouth and genitals (private parts). Wash Face and genitals (private parts)  with your normal soap.   6. Wash thoroughly, paying special attention to the area where your surgery will be performed.  7. Thoroughly rinse your body with warm water from the neck down.  8. DO NOT shower/wash with your normal soap after using and rinsing off the CHG Soap.  9. Pat  yourself dry with a CLEAN TOWEL.  10. Wear CLEAN PAJAMAS to bed the night before surgery, wear comfortable clothes the morning of surgery  11. Place CLEAN SHEETS on your bed the night of your first shower and DO NOT SLEEP WITH PETS.    Day of Surgery:  Please shower the morning of surgery with the CHG soap Do not apply any deodorants/lotions. Please wear clean clothes to the hospital/surgery center.   Remember to brush your teeth WITH YOUR REGULAR TOOTHPASTE.   Please read over the following fact sheets that you were given.

## 2019-05-13 ENCOUNTER — Encounter (HOSPITAL_COMMUNITY): Payer: Self-pay

## 2019-05-13 ENCOUNTER — Encounter (HOSPITAL_COMMUNITY)
Admission: RE | Admit: 2019-05-13 | Discharge: 2019-05-13 | Disposition: A | Discharge status: Home / Self Care | Payer: BC Managed Care – PPO | Source: Ambulatory Visit | Attending: Otolaryngology | Admitting: Otolaryngology

## 2019-05-13 ENCOUNTER — Other Ambulatory Visit: Payer: Self-pay

## 2019-05-13 ENCOUNTER — Other Ambulatory Visit (HOSPITAL_COMMUNITY)
Admission: RE | Admit: 2019-05-13 | Discharge: 2019-05-13 | Disposition: A | Discharge status: Home / Self Care | Payer: BC Managed Care – PPO | Source: Ambulatory Visit | Attending: Otolaryngology | Admitting: Otolaryngology

## 2019-05-13 DIAGNOSIS — Z01812 Encounter for preprocedural laboratory examination: Secondary | ICD-10-CM | POA: Insufficient documentation

## 2019-05-13 DIAGNOSIS — Z20828 Contact with and (suspected) exposure to other viral communicable diseases: Secondary | ICD-10-CM | POA: Diagnosis not present

## 2019-05-13 LAB — COMPREHENSIVE METABOLIC PANEL
ALT: 19 U/L (ref 0–44)
AST: 19 U/L (ref 15–41)
Albumin: 4.2 g/dL (ref 3.5–5.0)
Alkaline Phosphatase: 66 U/L (ref 38–126)
Anion gap: 11 (ref 5–15)
BUN: 9 mg/dL (ref 6–20)
CO2: 22 mmol/L (ref 22–32)
Calcium: 9.7 mg/dL (ref 8.9–10.3)
Chloride: 108 mmol/L (ref 98–111)
Creatinine, Ser: 0.55 mg/dL (ref 0.44–1.00)
GFR calc Af Amer: >60 mL/min (ref >60)
GFR calc non Af Amer: >60 mL/min (ref >60)
Glucose, Bld: 96 mg/dL (ref 70–99)
Potassium: 4.2 mmol/L (ref 3.5–5.1)
Sodium: 141 mmol/L (ref 135–145)
Total Bilirubin: 0.4 mg/dL (ref 0.3–1.2)
Total Protein: 6.7 g/dL (ref 6.5–8.1)

## 2019-05-13 LAB — CBC
HCT: 39.5 % (ref 36.0–46.0)
Hemoglobin: 12.5 g/dL (ref 12.0–15.0)
MCH: 28.5 pg (ref 26.0–34.0)
MCHC: 31.6 g/dL (ref 30.0–36.0)
MCV: 90.2 fL (ref 80.0–100.0)
Platelets: 301 10*3/uL (ref 150–400)
RBC: 4.38 MIL/uL (ref 3.87–5.11)
RDW: 13.7 % (ref 11.5–15.5)
WBC: 10.5 10*3/uL (ref 4.0–10.5)
nRBC: 0 % (ref 0.0–0.2)

## 2019-05-13 NOTE — Progress Notes (Signed)
PCP - Miles Costain Cardiologist - denies   Chest x-ray - N/A EKG - N/A Stress Test - denies ECHO - denies Cardiac Cath - denies  Sleep Study - h/o OSA CPAP - does not use   Aspirin Instructions: Patient instructed to hold all Aspirin, NSAID's, herbal medications, fish oil and vitamins 7 days prior to surgery.    COVID TEST- 05/13/19 at [facility]. Pt instructed to inform security they are having surgery and need to be in the right hand lane. Aware if they arrive at large "white tent", to redirect to the surgical line. Patient educated to quarantine at home following covid test until DOS.    Anesthesia review:   Patient denies shortness of breath, fever, cough and chest pain at PAT appointment   All instructions explained to the patient, with a verbal understanding of the material. Patient agrees to go over the instructions while at home for a better understanding. Patient also instructed to self quarantine after being tested for COVID-19. The opportunity to ask questions was provided.

## 2019-05-14 LAB — NOVEL CORONAVIRUS, NAA (HOSP ORDER, SEND-OUT TO REF LAB; TAT 18-24 HRS): SARS-CoV-2, NAA: NOT DETECTED

## 2019-05-15 ENCOUNTER — Encounter (HOSPITAL_COMMUNITY): Payer: Self-pay | Admitting: Otolaryngology

## 2019-05-15 NOTE — Anesthesia Preprocedure Evaluation (Signed)
Anesthesia Evaluation  Patient identified by MRN, date of birth, ID band Patient awake    Reviewed: Allergy & Precautions, NPO status , Patient's Chart, lab work & pertinent test results  History of Anesthesia Complications (+) PONV and history of anesthetic complications  Airway Mallampati: II  TM Distance: >3 FB Neck ROM: Full    Dental  (+) Teeth Intact, Dental Advisory Given   Pulmonary sleep apnea , former smoker,    breath sounds clear to auscultation       Cardiovascular negative cardio ROS   Rhythm:Regular Rate:Normal     Neuro/Psych negative neurological ROS  negative psych ROS   GI/Hepatic GERD  ,(+)     substance abuse  alcohol use,   Endo/Other  Hypothyroidism   Renal/GU negative Renal ROS  negative genitourinary   Musculoskeletal negative musculoskeletal ROS (+) Osteoarthritis,    Abdominal   Peds negative pediatric ROS (+)  Hematology negative hematology ROS (+) Blood dyscrasia, ,   Anesthesia Other Findings   Reproductive/Obstetrics negative OB ROS                             Anesthesia Physical  Anesthesia Plan  ASA: II  Anesthesia Plan: General   Post-op Pain Management:    Induction: Intravenous  PONV Risk Score and Plan: 4 or greater and Ondansetron, Dexamethasone and Scopolamine patch - Pre-op  Airway Management Planned: Oral ETT and LMA  Additional Equipment:   Intra-op Plan:   Post-operative Plan: Extubation in OR  Informed Consent: I have reviewed the patients History and Physical, chart, labs and discussed the procedure including the risks, benefits and alternatives for the proposed anesthesia with the patient or authorized representative who has indicated his/her understanding and acceptance.     Dental advisory given  Plan Discussed with: CRNA and Anesthesiologist  Anesthesia Plan Comments: (  )        Anesthesia Quick  Evaluation

## 2019-05-16 ENCOUNTER — Other Ambulatory Visit: Payer: Self-pay

## 2019-05-16 ENCOUNTER — Ambulatory Visit (HOSPITAL_COMMUNITY): Payer: BC Managed Care – PPO

## 2019-05-16 ENCOUNTER — Encounter (HOSPITAL_COMMUNITY)
Admission: RE | Disposition: A | Discharge status: Home / Self Care | Payer: Self-pay | Source: Home / Self Care | Attending: Otolaryngology

## 2019-05-16 ENCOUNTER — Encounter (HOSPITAL_COMMUNITY): Payer: Self-pay | Admitting: Otolaryngology

## 2019-05-16 ENCOUNTER — Ambulatory Visit (HOSPITAL_COMMUNITY)
Admission: RE | Admit: 2019-05-16 | Discharge: 2019-05-16 | Disposition: A | Discharge status: Home / Self Care | Payer: BC Managed Care – PPO | Attending: Otolaryngology | Admitting: Otolaryngology

## 2019-05-16 DIAGNOSIS — M1990 Unspecified osteoarthritis, unspecified site: Secondary | ICD-10-CM | POA: Insufficient documentation

## 2019-05-16 DIAGNOSIS — Z888 Allergy status to other drugs, medicaments and biological substances status: Secondary | ICD-10-CM | POA: Insufficient documentation

## 2019-05-16 DIAGNOSIS — Z886 Allergy status to analgesic agent status: Secondary | ICD-10-CM | POA: Diagnosis not present

## 2019-05-16 DIAGNOSIS — Z87891 Personal history of nicotine dependence: Secondary | ICD-10-CM | POA: Insufficient documentation

## 2019-05-16 DIAGNOSIS — Z791 Long term (current) use of non-steroidal anti-inflammatories (NSAID): Secondary | ICD-10-CM | POA: Diagnosis not present

## 2019-05-16 DIAGNOSIS — K5090 Crohn's disease, unspecified, without complications: Secondary | ICD-10-CM | POA: Insufficient documentation

## 2019-05-16 DIAGNOSIS — Z79899 Other long term (current) drug therapy: Secondary | ICD-10-CM | POA: Insufficient documentation

## 2019-05-16 DIAGNOSIS — G4733 Obstructive sleep apnea (adult) (pediatric): Secondary | ICD-10-CM | POA: Insufficient documentation

## 2019-05-16 DIAGNOSIS — E039 Hypothyroidism, unspecified: Secondary | ICD-10-CM | POA: Diagnosis not present

## 2019-05-16 DIAGNOSIS — E538 Deficiency of other specified B group vitamins: Secondary | ICD-10-CM | POA: Diagnosis not present

## 2019-05-16 DIAGNOSIS — M170 Bilateral primary osteoarthritis of knee: Secondary | ICD-10-CM | POA: Insufficient documentation

## 2019-05-16 DIAGNOSIS — F419 Anxiety disorder, unspecified: Secondary | ICD-10-CM | POA: Insufficient documentation

## 2019-05-16 DIAGNOSIS — M19041 Primary osteoarthritis, right hand: Secondary | ICD-10-CM | POA: Insufficient documentation

## 2019-05-16 DIAGNOSIS — E785 Hyperlipidemia, unspecified: Secondary | ICD-10-CM | POA: Diagnosis not present

## 2019-05-16 DIAGNOSIS — K219 Gastro-esophageal reflux disease without esophagitis: Secondary | ICD-10-CM | POA: Diagnosis not present

## 2019-05-16 DIAGNOSIS — M19042 Primary osteoarthritis, left hand: Secondary | ICD-10-CM | POA: Insufficient documentation

## 2019-05-16 DIAGNOSIS — Z9889 Other specified postprocedural states: Secondary | ICD-10-CM

## 2019-05-16 HISTORY — PX: IMPLANTATION OF HYPOGLOSSAL NERVE STIMULATOR: SHX6827

## 2019-05-16 SURGERY — INSERTION, HYPOGLOSSAL NERVE STIMULATOR
Anesthesia: General

## 2019-05-16 MED ORDER — PHENYLEPHRINE HCL (PRESSORS) 10 MG/ML IV SOLN
INTRAVENOUS | Status: DC | PRN
  Administered 2019-05-16: 100 ug via INTRAVENOUS
  Administered 2019-05-16: 60 ug via INTRAVENOUS
  Administered 2019-05-16: 100 ug via INTRAVENOUS

## 2019-05-16 MED ORDER — SODIUM CHLORIDE 0.9 % IV SOLN
0.0125 ug/kg/min | INTRAVENOUS | Status: AC
  Administered 2019-05-16: 08:00:00 .05 ug/kg/min via INTRAVENOUS
  Filled 2019-05-16: qty 2000

## 2019-05-16 MED ORDER — MIDAZOLAM HCL 2 MG/2ML IJ SOLN
INTRAMUSCULAR | Status: AC
  Filled 2019-05-16: qty 2

## 2019-05-16 MED ORDER — ACETAMINOPHEN 325 MG PO TABS
ORAL_TABLET | ORAL | Status: AC
  Filled 2019-05-16: qty 2

## 2019-05-16 MED ORDER — OXYCODONE HCL 5 MG PO TABS
5.0000 mg | ORAL_TABLET | Freq: Once | ORAL | Status: AC | PRN
  Administered 2019-05-16: 5 mg via ORAL

## 2019-05-16 MED ORDER — SODIUM CHLORIDE 0.9 % IV SOLN
INTRAVENOUS | Status: DC | PRN
  Administered 2019-05-16: 500 mL

## 2019-05-16 MED ORDER — HYDROCODONE-ACETAMINOPHEN 5-325 MG PO TABS: 1.0000 | ORAL_TABLET | Freq: Four times a day (QID) | ORAL | 0 refills | Status: AC | PRN

## 2019-05-16 MED ORDER — PROPOFOL 500 MG/50ML IV EMUL
INTRAVENOUS | Status: DC | PRN
  Administered 2019-05-16: 50 ug/kg/min via INTRAVENOUS

## 2019-05-16 MED ORDER — OXYCODONE HCL 5 MG PO TABS
ORAL_TABLET | ORAL | Status: AC
  Filled 2019-05-16: qty 1

## 2019-05-16 MED ORDER — PHENYLEPHRINE 40 MCG/ML (10ML) SYRINGE FOR IV PUSH (FOR BLOOD PRESSURE SUPPORT)
PREFILLED_SYRINGE | INTRAVENOUS | Status: AC
  Filled 2019-05-16: qty 10

## 2019-05-16 MED ORDER — SUCCINYLCHOLINE CHLORIDE 20 MG/ML IJ SOLN
INTRAMUSCULAR | Status: DC | PRN
  Administered 2019-05-16: 120 mg via INTRAVENOUS

## 2019-05-16 MED ORDER — LACTATED RINGERS IV SOLN
INTRAVENOUS | Status: DC | PRN
  Administered 2019-05-16: 08:00:00 via INTRAVENOUS

## 2019-05-16 MED ORDER — EPHEDRINE SULFATE 50 MG/ML IJ SOLN
INTRAMUSCULAR | Status: DC | PRN
  Administered 2019-05-16 (×3): 10 mg via INTRAVENOUS
  Administered 2019-05-16 (×2): 5 mg via INTRAVENOUS

## 2019-05-16 MED ORDER — EPHEDRINE 5 MG/ML INJ
INTRAVENOUS | Status: AC
  Filled 2019-05-16: qty 10

## 2019-05-16 MED ORDER — ACETAMINOPHEN 160 MG/5ML PO SOLN: 325.0000 mg | ORAL | Status: DC | PRN

## 2019-05-16 MED ORDER — PROPOFOL 10 MG/ML IV BOLUS
INTRAVENOUS | Status: AC
  Filled 2019-05-16: qty 20

## 2019-05-16 MED ORDER — DEXAMETHASONE SODIUM PHOSPHATE 10 MG/ML IJ SOLN
INTRAMUSCULAR | Status: AC
  Filled 2019-05-16: qty 1

## 2019-05-16 MED ORDER — SUCCINYLCHOLINE CHLORIDE 200 MG/10ML IV SOSY
PREFILLED_SYRINGE | INTRAVENOUS | Status: AC
  Filled 2019-05-16: qty 10

## 2019-05-16 MED ORDER — LIDOCAINE-EPINEPHRINE 1 %-1:100000 IJ SOLN
INTRAMUSCULAR | Status: AC
  Filled 2019-05-16: qty 1

## 2019-05-16 MED ORDER — LIDOCAINE 2% (20 MG/ML) 5 ML SYRINGE
INTRAMUSCULAR | Status: AC
  Filled 2019-05-16: qty 5

## 2019-05-16 MED ORDER — FENTANYL CITRATE (PF) 100 MCG/2ML IJ SOLN
INTRAMUSCULAR | Status: DC | PRN
  Administered 2019-05-16: 50 ug via INTRAVENOUS

## 2019-05-16 MED ORDER — FENTANYL CITRATE (PF) 250 MCG/5ML IJ SOLN
INTRAMUSCULAR | Status: AC
  Filled 2019-05-16: qty 5

## 2019-05-16 MED ORDER — OXYCODONE HCL 5 MG/5ML PO SOLN: 5.0000 mg | Freq: Once | ORAL | Status: AC | PRN

## 2019-05-16 MED ORDER — MEPERIDINE HCL 25 MG/ML IJ SOLN: 6.2500 mg | INTRAMUSCULAR | Status: DC | PRN

## 2019-05-16 MED ORDER — FENTANYL CITRATE (PF) 100 MCG/2ML IJ SOLN
INTRAMUSCULAR | Status: AC
  Filled 2019-05-16: qty 2

## 2019-05-16 MED ORDER — SODIUM CHLORIDE 0.9 % IV SOLN
INTRAVENOUS | Status: AC
  Filled 2019-05-16: qty 500000

## 2019-05-16 MED ORDER — ONDANSETRON HCL 4 MG/2ML IJ SOLN
INTRAMUSCULAR | Status: AC
  Filled 2019-05-16: qty 2

## 2019-05-16 MED ORDER — CEFAZOLIN SODIUM-DEXTROSE 2-4 GM/100ML-% IV SOLN
2.0000 g | INTRAVENOUS | Status: AC
  Administered 2019-05-16: 2 g via INTRAVENOUS
  Filled 2019-05-16: qty 100

## 2019-05-16 MED ORDER — ONDANSETRON HCL 4 MG/2ML IJ SOLN: 4.0000 mg | Freq: Once | INTRAMUSCULAR | Status: DC | PRN

## 2019-05-16 MED ORDER — LIDOCAINE 2% (20 MG/ML) 5 ML SYRINGE
INTRAMUSCULAR | Status: DC | PRN
  Administered 2019-05-16: 40 mg via INTRAVENOUS
  Administered 2019-05-16: 60 mg via INTRAVENOUS

## 2019-05-16 MED ORDER — FENTANYL CITRATE (PF) 100 MCG/2ML IJ SOLN
25.0000 ug | INTRAMUSCULAR | Status: DC | PRN
  Administered 2019-05-16 (×3): 25 ug via INTRAVENOUS

## 2019-05-16 MED ORDER — ACETAMINOPHEN 325 MG PO TABS
325.0000 mg | ORAL_TABLET | ORAL | Status: DC | PRN
  Administered 2019-05-16: 12:00:00 650 mg via ORAL

## 2019-05-16 MED ORDER — LIDOCAINE-EPINEPHRINE 1 %-1:100000 IJ SOLN
INTRAMUSCULAR | Status: DC | PRN
  Administered 2019-05-16: 7.5 mL

## 2019-05-16 MED ORDER — ONDANSETRON HCL 4 MG/2ML IJ SOLN
INTRAMUSCULAR | Status: DC | PRN
  Administered 2019-05-16: 4 mg via INTRAVENOUS

## 2019-05-16 MED ORDER — MIDAZOLAM HCL 5 MG/5ML IJ SOLN
INTRAMUSCULAR | Status: DC | PRN
  Administered 2019-05-16: 2 mg via INTRAVENOUS

## 2019-05-16 MED ORDER — DEXAMETHASONE SODIUM PHOSPHATE 10 MG/ML IJ SOLN
INTRAMUSCULAR | Status: DC | PRN
  Administered 2019-05-16: 10 mg via INTRAVENOUS

## 2019-05-16 MED ORDER — PROPOFOL 10 MG/ML IV BOLUS
INTRAVENOUS | Status: DC | PRN
  Administered 2019-05-16: 50 mg via INTRAVENOUS
  Administered 2019-05-16 (×2): 30 mg via INTRAVENOUS
  Administered 2019-05-16: 50 mg via INTRAVENOUS
  Administered 2019-05-16: 30 mg via INTRAVENOUS
  Administered 2019-05-16: 150 mg via INTRAVENOUS

## 2019-05-16 MED ORDER — 0.9 % SODIUM CHLORIDE (POUR BTL) OPTIME
TOPICAL | Status: DC | PRN
  Administered 2019-05-16: 1000 mL

## 2019-05-16 MED ORDER — STERILE WATER FOR IRRIGATION IR SOLN
Status: DC | PRN
  Administered 2019-05-16: 1000 mL

## 2019-05-16 MED ORDER — PHENYLEPHRINE HCL-NACL 10-0.9 MG/250ML-% IV SOLN
INTRAVENOUS | Status: DC | PRN
  Administered 2019-05-16: 40 ug/min via INTRAVENOUS
  Administered 2019-05-16: 80 ug/min via INTRAVENOUS

## 2019-05-16 SURGICAL SUPPLY — 66 items
BAG DECANTER FOR FLEXI CONT (MISCELLANEOUS) ×3 IMPLANT
BLADE CLIPPER SURG (BLADE) IMPLANT
BLADE SURG 15 STRL LF DISP TIS (BLADE) ×3 IMPLANT
BLADE SURG 15 STRL SS (BLADE) ×6
CANISTER SUCT 3000ML PPV (MISCELLANEOUS) ×3 IMPLANT
CORD BIPOLAR FORCEPS 12FT (ELECTRODE) ×3 IMPLANT
COVER PROBE W GEL 5X96 (DRAPES) ×3 IMPLANT
COVER SURGICAL LIGHT HANDLE (MISCELLANEOUS) ×3 IMPLANT
COVER WAND RF STERILE (DRAPES) IMPLANT
DERMABOND ADVANCED (GAUZE/BANDAGES/DRESSINGS) ×6
DERMABOND ADVANCED .7 DNX12 (GAUZE/BANDAGES/DRESSINGS) ×3 IMPLANT
DRAPE C-ARM 35X43 STRL (DRAPES) ×3 IMPLANT
DRAPE HEAD BAR (DRAPES) ×3 IMPLANT
DRAPE INCISE IOBAN 66X45 STRL (DRAPES) ×3 IMPLANT
DRAPE MICROSCOPE LEICA 54X105 (DRAPE) ×3 IMPLANT
DRAPE UTILITY XL STRL (DRAPES) IMPLANT
DRSG TEGADERM 2-3/8X2-3/4 SM (GAUZE/BANDAGES/DRESSINGS) ×6 IMPLANT
ELECT COATED BLADE 2.86 ST (ELECTRODE) ×3 IMPLANT
ELECT EMG 18 NIMS (NEUROSURGERY SUPPLIES) ×3
ELECTRODE EMG 18 NIMS (NEUROSURGERY SUPPLIES) ×1 IMPLANT
FORCEPS BIPOLAR SPETZLER 8 1.0 (NEUROSURGERY SUPPLIES) ×3 IMPLANT
GAUZE 4X4 16PLY RFD (DISPOSABLE) ×3 IMPLANT
GAUZE SPONGE 4X4 12PLY STRL (GAUZE/BANDAGES/DRESSINGS) ×9 IMPLANT
GENERATOR PULSE INSPIRE (Generator) ×3 IMPLANT
GLOVE BIO SURGEON STRL SZ 6.5 (GLOVE) IMPLANT
GLOVE BIO SURGEONS STRL SZ 6.5 (GLOVE)
GLOVE BIOGEL M 7.0 STRL (GLOVE) ×3 IMPLANT
GOWN STRL REUS W/ TWL LRG LVL3 (GOWN DISPOSABLE) ×2 IMPLANT
GOWN STRL REUS W/TWL LRG LVL3 (GOWN DISPOSABLE) ×4
KIT BASIN OR (CUSTOM PROCEDURE TRAY) ×3 IMPLANT
KIT NEUROSTIMULATOR ACCESSORY (KITS) IMPLANT
KIT TURNOVER KIT B (KITS) ×3 IMPLANT
LEAD SENSING RESP INSPIRE (Lead) ×3 IMPLANT
LEAD SLEEP STIMULATION INSPIRE (Lead) ×3 IMPLANT
LOOP VESSEL MAXI BLUE (MISCELLANEOUS) ×3 IMPLANT
LOOP VESSEL MINI RED (MISCELLANEOUS) ×3 IMPLANT
MARKER SKIN DUAL TIP RULER LAB (MISCELLANEOUS) ×6 IMPLANT
NEEDLE HYPO 25GX1X1/2 BEV (NEEDLE) ×3 IMPLANT
NS IRRIG 1000ML POUR BTL (IV SOLUTION) ×3 IMPLANT
PAD ARMBOARD 7.5X6 YLW CONV (MISCELLANEOUS) ×3 IMPLANT
PASSER CATH 38CM DISP (INSTRUMENTS) ×3 IMPLANT
PENCIL BUTTON HOLSTER BLD 10FT (ELECTRODE) ×3 IMPLANT
POSITIONER HEAD DONUT 9IN (MISCELLANEOUS) ×3 IMPLANT
PROBE NERVE STIMULATOR (NEUROSURGERY SUPPLIES) ×3 IMPLANT
REMOTE CONTROL SLEEP INSPIRE (MISCELLANEOUS) ×3 IMPLANT
SET WALTER ACTIVATION W/DRAPE (SET/KITS/TRAYS/PACK) ×3 IMPLANT
SLING ARM FOAM STRAP LRG (SOFTGOODS) ×3 IMPLANT
SLING ARM FOAM STRAP MED (SOFTGOODS) IMPLANT
SPONGE INTESTINAL PEANUT (DISPOSABLE) ×6 IMPLANT
STAPLER VISISTAT 35W (STAPLE) ×3 IMPLANT
SUT SILK 2 0 SH (SUTURE) ×6 IMPLANT
SUT SILK 3 0 RB1 (SUTURE) IMPLANT
SUT SILK 3 0 REEL (SUTURE) ×3 IMPLANT
SUT SILK 3 0 SH 30 (SUTURE) IMPLANT
SUT SILK 3-0 (SUTURE) ×4
SUT SILK 3-0 RB1 30XBRD (SUTURE) ×2
SUT VIC AB 3-0 SH 27 (SUTURE) ×2
SUT VIC AB 3-0 SH 27X BRD (SUTURE) ×1 IMPLANT
SUT VIC AB 4-0 PS2 27 (SUTURE) ×6 IMPLANT
SUT VIC AB 5-0 P-3 18XBRD (SUTURE) ×2 IMPLANT
SUT VIC AB 5-0 P3 18 (SUTURE) ×4
SUTURE SILK 3-0 RB1 30XBRD (SUTURE) ×2 IMPLANT
SYR 10ML LL (SYRINGE) ×3 IMPLANT
TAPE CLOTH SURG 4X10 WHT LF (GAUZE/BANDAGES/DRESSINGS) ×9 IMPLANT
TOWEL GREEN STERILE (TOWEL DISPOSABLE) ×3 IMPLANT
TRAY ENT MC OR (CUSTOM PROCEDURE TRAY) ×3 IMPLANT

## 2019-05-16 NOTE — Op Note (Signed)
Operative Note: INSPIRE IMPLANT  Patient: Tonya Chambers  Medical record number: [id number]  Date:05/16/2019  Pre-operative Indications: Moderate/severe obstructive sleep apnea with positive airway pressure intolerance  Postoperative Indications: Same  Surgical Procedure:  1.  12th cranial nerve (hypoglossal) stimulation implant    2.  Placement of chest wall respiratory sensor    3.  Electronic analysis of implanted neurostimulator with pulse generator system  Anesthesia: GET  Surgeon: Delsa Bern, M.D.  Assist: Jolene Provost, PA  PA Riki Altes assistance was required throughout the surgical procedure including surgical planning, retraction, management of bleeding and surgical decision-making throughout the operation.  Complications: None  EBL: 50cc   Brief History: The patient is a 53 y.o. female with a history of moderate/severe obstructive sleep apnea.  The patient has undergone work-up including sleep study and trial of CPAP which they did not tolerate.  Patient is unable to use long-term CPAP for control of obstructive sleep apnea.  Patient underwent DISE which showed anterior to posterior airway obstruction, considered a good candidate for Inspire Implant. Given the patient's history and findings, I recommended Inspire Implant under general anesthesia, risks and benefits were discussed in detail with the patient and family. They understand and agree with our plan for surgery which is scheduled at [facility] on an elective basis.  Surgical Procedure: The patient is brought to the operating room on 05/16/2019 and placed in supine position on the operating table. General endotracheal anesthesia was established without difficulty. When the patient was adequately anesthetized, surgical timeout was performed and correct identification of the patient and the surgical procedure. The patient was injected with 7 cc of 1% lidocaine 1:100,000 dilution epinephrine in  subcutaneous fashion in the proposed skin incisions.  The patient was positioned and prepped and draped in sterile fashion.  The NIMS monitoring system was positioned and electrodes were placed in the anterior floor of mouth and tongue to assess the genioglossus and styloglossus muscle groups.  Nerve monitoring was used throughout the surgical procedure.  The procedure was begun by creating a modified right submandibular incision in the upper anterior lateral neck ~2 cm below the mandible and in a natural skin crease.  The incision was carried through the skin and underlying subcutaneous tissue to the level of the platysma.  Platysma muscle was divided and subplatysmal flaps were elevated superiorly and inferiorly.  The submandibular space was entered and the submandibular gland was identified and retracted posteriorly.  The digastric tendon was identified and dissection was carried out anterior and posterior along the tendon and muscle bellies.  The mylohyoid muscle was identified and retracted anteriorly and the hypoglossal nerve was carefully dissected across the anterior floor of the submandibular space.  Lateral branches to the retrusor muscles were identified and tested intraoperatively using the NIMS stimulator.  Stimulation electrode cuff for the hypoglossal nerve stimulator was placed distal to these branches on the medial hypoglossal nerve branch innervating the genioglossus muscle.  Diagnostic evaluation confirmed activation of the genioglossus muscle, resulting in genioglossal activation and tongue protrusion which was visually confirmed in the operating room.  The stimulation electrode was  sutured to the digastric tendon with interrupted 4-0 silk sutures.  A second second incision was created in the anterior upper chest wall 5 cm below the clavicle.  Incision was carried through the skin and underlying subcutaneous tissue to the level of the pectoralis major muscle.  The IPG pocket was created deep  to the subcutaneous layer and superficial to the muscle.  Stimulation lead was then tunneled in a subplatysmal plane and brought out into the subclavicular pocket.  Third incision was made in the axillary line measuring approximately 5 cm.  Dissection was carried down through the skin and underlying subcutaneous tissue.  The serratus anterior muscle was identified and separated.  The external oblique muscle was identified and incised and a tunnel was created between the external and internal intercostal muscles in the approximately fifth intercostal space on the superior edge of the sixth rib.  The pleural sensor was then placed in the intercostal pocket.  Leads were sutured with 3-0 silk suture to the provided anchors which were used to fix the sensor in its proper orientation facing the pleural space.  The sensing lead was then tunneled in a subcutaneous plane to the subclavicular pocket.  The stimulating electrode and respiration sensing lead were connected to the implantable pulse generator.  Diagnostic evaluation was run confirming respiratory signal and good tongue protrusion on stimulation.  The implantable pulse generator was then placed in the subclavicular pocket and sutured to the pectoralis fascia with 4-0 silk sutures sutures.  The incisions were thoroughly irrigated with bacitracin saline irrigation.  The incisions were then closed in multiple layers with 4-0 and 5-0 Vicryl interrupted sutures in the deep and superficial subcutaneous levels at each incision site.  Dermabond surgical glue was used for skin closure.  The patient's incisions were dressed with rolled gauze and Hypafix tape for pressure dressing.  The patient's right arm was placed in a sling.  An orogastric tube was passed and stomach contents were aspirated. Patient was awakened from anesthetic and transferred from the operating room to the recovery room in stable condition. There were no complications and blood loss was minimal.   X-rays were obtained in the recovery room to assess the proper location of the pulse generator, sensor lead and stimulation lead.   Delsa Bern, M.D. [facility] ENT 05/16/2019

## 2019-05-16 NOTE — Anesthesia Procedure Notes (Signed)
Procedure Name: Intubation Date/Time: 05/16/2019 7:45 AM Performed by: Janeece Riggers, MD Pre-anesthesia Checklist: Patient identified, Emergency Drugs available, Suction available and Patient being monitored Patient Re-evaluated:Patient Re-evaluated prior to induction Oxygen Delivery Method: Circle system utilized Preoxygenation: Pre-oxygenation with 100% oxygen Induction Type: IV induction Ventilation: Mask ventilation without difficulty Laryngoscope Size: Mac and 3 Grade View: Grade II Tube type: Oral Tube size: 7.0 mm Number of attempts: 1 Airway Equipment and Method: Stylet and Oral airway Placement Confirmation: ETT inserted through vocal cords under direct vision,  positive ETCO2 and breath sounds checked- equal and bilateral Secured at: 23 cm Tube secured with: Tape Dental Injury: Teeth and Oropharynx as per pre-operative assessment  Comments: Atraumatic intubation by Nolon Lennert, SRNA

## 2019-05-16 NOTE — H&P (Signed)
Tonya Chambers is an 53 y.o. female.   Chief Complaint: Obstructive Sleep Apnea HPI: Hx of Prog OSA, unable to tol po  Past Medical History:  Diagnosis Date  . Anemia   . Anxiety   . Arthritis    wrist, hands, knees  . B12 deficiency   . Bursitis   . Cancer (HCC)    Hyperplasia of uterus  . Complication of anesthesia   . Crohn's disease ([place])   . Endometriosis   . ETOH abuse   . GERD (gastroesophageal reflux disease)   . HSV-2 infection   . Hyperlipemia    diet controlled  . Hypothyroidism   . PONV (postoperative nausea and vomiting)   . Sleep apnea    does not use cpap  . Wears glasses     Past Surgical History:  Procedure Laterality Date  . CERVICAL BIOPSY  W/ LOOP ELECTRODE EXCISION    . CESAREAN SECTION     x 1  . CYSTECTOMY     cyst over eye removed as child  . DILATION AND CURETTAGE OF UTERUS    . DRUG INDUCED ENDOSCOPY N/A 08/21/2018   Procedure: DRUG INDUCED ENDOSCOPY;  Surgeon: Jerrell Belfast, MD;  Location: [place];  Service: ENT;  Laterality: N/A;  . ENDOMETRIAL BIOPSY    . ESSURE TUBAL LIGATION  2013  . EXPLORATORY LAPAROTOMY    . KNEE ARTHROSCOPY Right   . NASAL SEPTOPLASTY W/ TURBINOPLASTY Bilateral 08/21/2018   Procedure: NASAL SEPTOPLASTY WITH TURBINATE REDUCTION;  Surgeon: Jerrell Belfast, MD;  Location: [place];  Service: ENT;  Laterality: Bilateral;  . TONSILLECTOMY    . UPPER GI ENDOSCOPY      History reviewed. No pertinent family history. Social History:  reports that she quit smoking about 17 years ago. Her smoking use included cigarettes. She has a 20.00 pack-year smoking history. She has never used smokeless tobacco. She reports current alcohol use of about 14.0 - 21.0 standard drinks of alcohol per week. She reports that she does not use drugs.  Allergies:  Allergies  Allergen Reactions  . Nsaids Other (See Comments)    Patient has crohn's disease    Medications Prior to Admission  Medication Sig Dispense Refill  . acetaminophen  (TYLENOL) 500 MG tablet Take 1,000 mg by mouth every 6 (six) hours as needed for moderate pain.     Marland Kitchen Apoaequorin (PREVAGEN PO) Take 1 tablet by mouth daily.    . Biotin 5000 MCG TABS Take 5,000 mcg by mouth 3 (three) times a week.    Marland Kitchen CANNABIDIOL PO Place 1 drop under the tongue daily. CBD OIL 1 DROPPERFUL     . celecoxib (CELEBREX) 100 MG capsule Take 200 mg by mouth daily as needed for moderate pain.     . clobetasol (TEMOVATE) 0.05 % external solution Apply 1 application topically 2 (two) times daily as needed (itching).     Marland Kitchen docusate sodium (COLACE) 250 MG capsule Take 250 mg by mouth at bedtime.    . DULoxetine (CYMBALTA) 30 MG capsule Take 30 mg by mouth 2 (two) times daily.    Marland Kitchen estradiol (ESTRACE) 1 MG tablet Take 1 mg by mouth daily.    . fluticasone (FLONASE) 50 MCG/ACT nasal spray Place 2 sprays into both nostrils daily as needed for allergies or rhinitis.    Marland Kitchen levothyroxine (SYNTHROID, LEVOTHROID) 50 MCG tablet Take 50 mcg by mouth daily before breakfast.    . Multiple Vitamin (MULTIVITAMIN WITH MINERALS) TABS tablet Take 1 tablet  by mouth daily.    . Omega-3 Fatty Acids (FISH OIL) 1000 MG CAPS Take 1,000 mg by mouth daily.    Marland Kitchen omeprazole (PRILOSEC) 40 MG capsule Take 40 mg by mouth 2 (two) times daily.     . progesterone (PROMETRIUM) 200 MG capsule Take 200 mg by mouth at bedtime.    Vladimir Faster Glycol-Propyl Glycol (LUBRICANT EYE DROPS) 0.4-0.3 % SOLN Place 1 drop into both eyes 3 (three) times daily as needed (itchy/red eyes.).      No results found for this or any previous visit (from the past 48 hour(s)). No results found.  Review of Systems  Constitutional: Negative.   Respiratory: Negative.   Cardiovascular: Negative.     Blood pressure (!) 149/80, pulse 71, temperature 98.3 F (36.8 C), temperature source Oral, resp. rate 17, height 5\' 6"  (1.676 m), weight 76.2 kg, SpO2 98 %. Physical Exam  Constitutional: She appears well-developed and well-nourished.   Cardiovascular: Normal rate.  Respiratory: Effort normal.  Musculoskeletal:     Cervical back: Normal range of motion and neck supple.     Assessment/Plan Adm for Hypoglossal Stimulator Implant (Inspire Implant) under GA as OP  Jerrell Belfast, MD 05/16/2019, 7:33 AM

## 2019-05-16 NOTE — Transfer of Care (Signed)
Immediate Anesthesia Transfer of Care Note  Patient: Tonya Chambers  Procedure(s) Performed: IMPLANTATION OF HYPOGLOSSAL NERVE STIMULATOR (N/A )  Patient Location: PACU  Anesthesia Type:General  Level of Consciousness: awake, alert  and oriented  Airway & Oxygen Therapy: Patient Spontanous Breathing and Patient connected to face mask oxygen  Post-op Assessment: Report given to RN and Post -op Vital signs reviewed and stable  Post vital signs: Reviewed and stable  Last Vitals:  Vitals Value Taken Time  BP 145/87 05/16/19 1100  Temp    Pulse 79 05/16/19 1104  Resp 16 05/16/19 1104  SpO2 95 % 05/16/19 1104  Vitals shown include unvalidated device data.  Last Pain:  Vitals:   05/16/19 1100  TempSrc:   PainSc: (P) 0-No pain         Complications: No apparent anesthesia complications

## 2019-05-18 NOTE — Anesthesia Postprocedure Evaluation (Signed)
Anesthesia Post Note  Patient: Tonya Chambers  Procedure(s) Performed: IMPLANTATION OF HYPOGLOSSAL NERVE STIMULATOR (N/A )     Anesthesia Post Evaluation  Last Vitals:  Vitals:   05/16/19 1245 05/16/19 1300  BP: (!) 154/92 (!) 150/90  Pulse: 65 69  Resp: 18 13  Temp:  36.6 C  SpO2: 99% 96%    Last Pain:  Vitals:   05/16/19 1300  TempSrc:   PainSc: Asleep                 Sherrice Creekmore

## 2019-05-18 NOTE — Addendum Note (Signed)
Addendum  created 05/18/19 1938 by Janeece Riggers, MD   Clinical Note Signed

## 2019-05-18 NOTE — Anesthesia Postprocedure Evaluation (Signed)
Anesthesia Post Note  Patient: Tonya Chambers  Procedure(s) Performed: IMPLANTATION OF HYPOGLOSSAL NERVE STIMULATOR (N/A )     Anesthesia Post Evaluation  Last Vitals:  Vitals:   05/16/19 1245 05/16/19 1300  BP: (!) 154/92 (!) 150/90  Pulse: 65 69  Resp: 18 13  Temp:  36.6 C  SpO2: 99% 96%    Last Pain:  Vitals:   05/16/19 1300  TempSrc:   PainSc: Asleep                 Jamilee Lafosse

## 2019-05-19 ENCOUNTER — Encounter: Payer: Self-pay | Admitting: *Deleted

## 2020-04-14 IMAGING — DX DG NECK SOFT TISSUE
2 series · 2 of 2 positions shown · non-contrast
Comparison: None.

CLINICAL DATA: Postoperative status.

EXAM:
NECK SOFT TISSUES - 1+ VIEW

[neck ap]
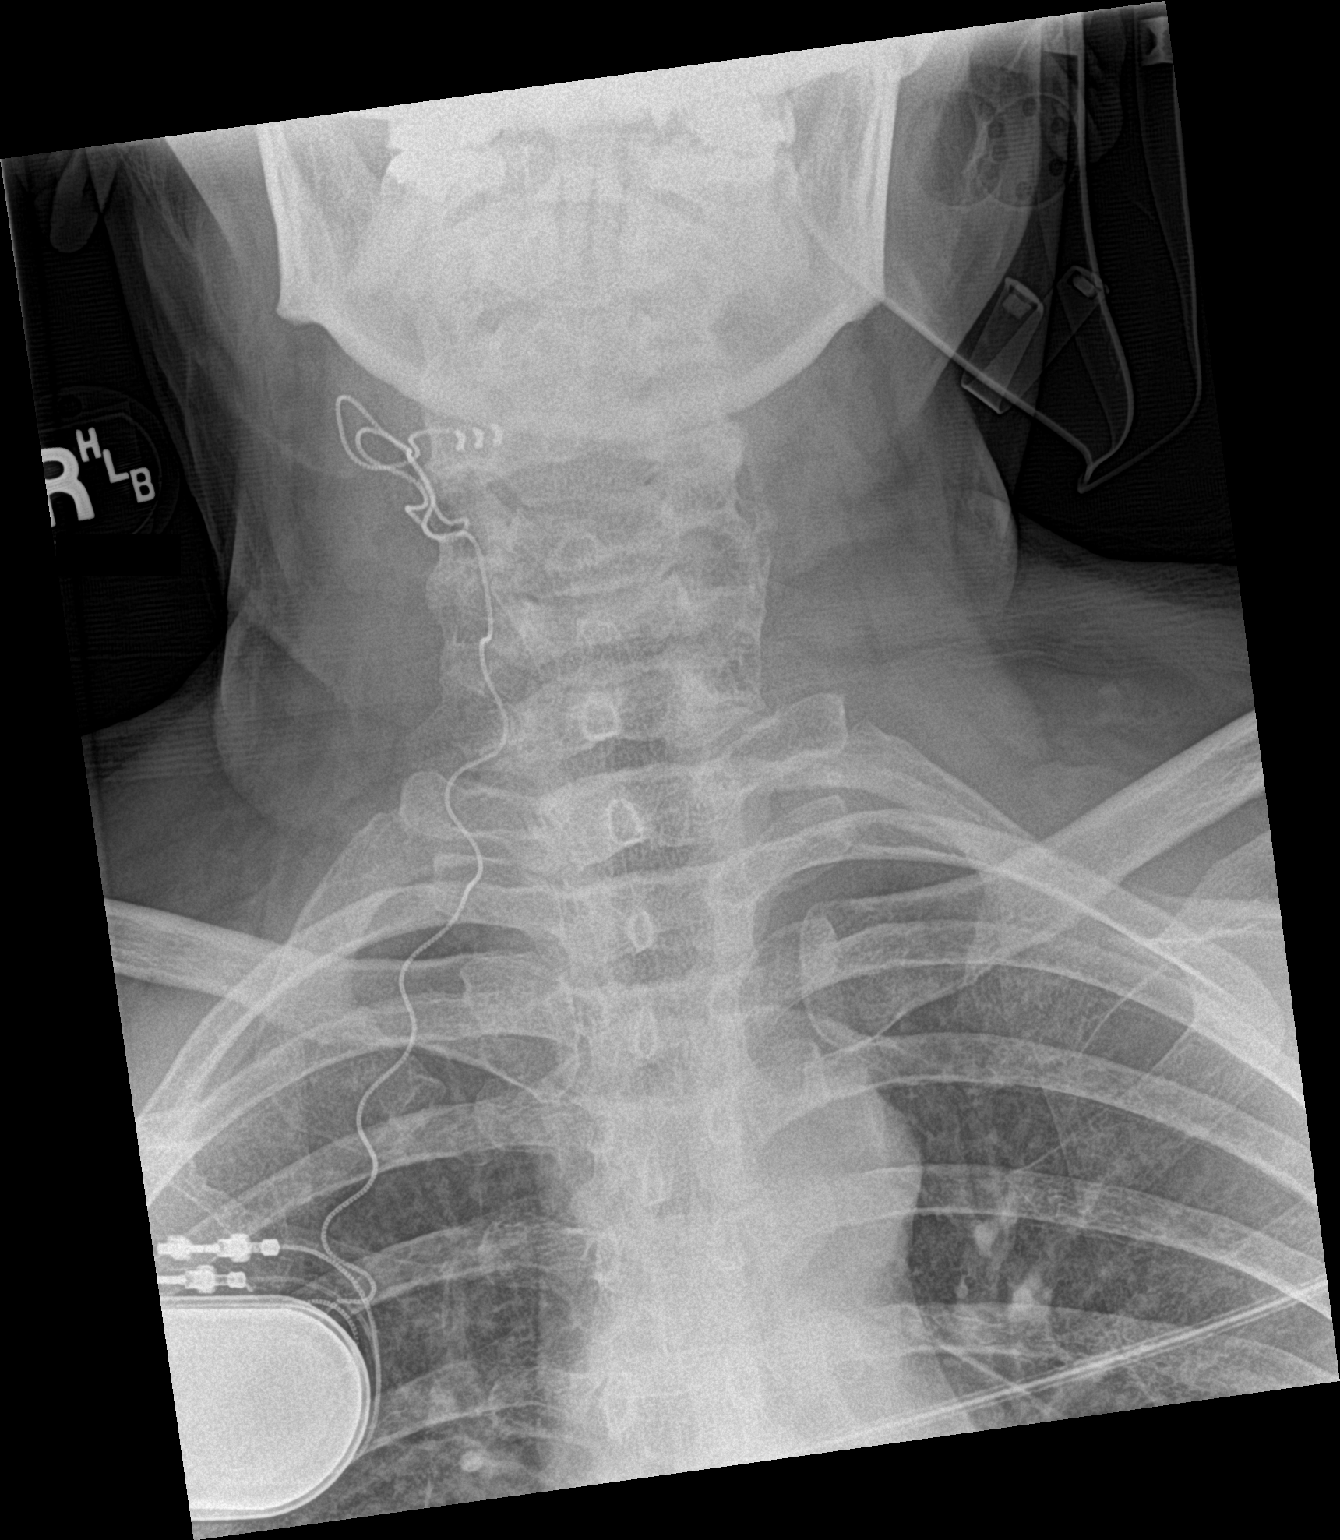

[neck lat]
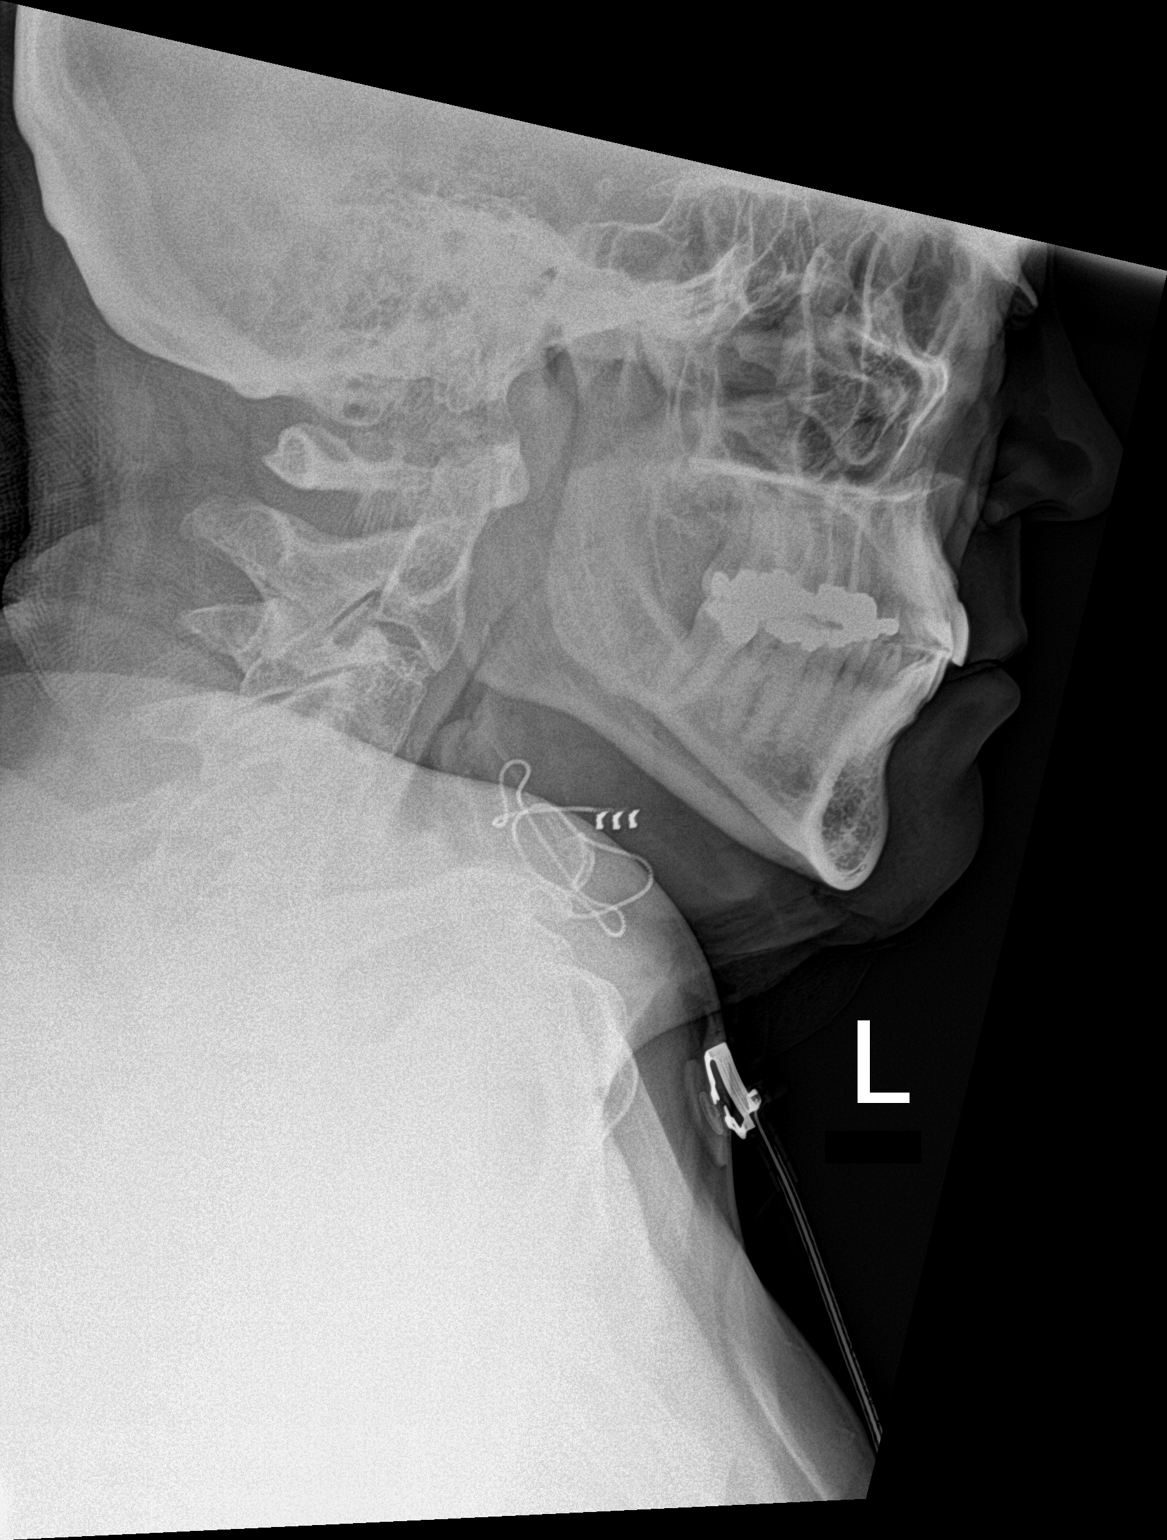

[2 of 2 positions shown; findings below may reference images not displayed]

FINDINGS: There is no evidence of retropharyngeal soft tissue swelling or
epiglottic enlargement. The cervical airway is unremarkable.
Superior lead of stimulator device is seen in the soft tissues
anterior to the hypopharynx.
IMPRESSION: Superior lead of stimulator device is seen in soft tissues anterior
to the hypopharynx. No other abnormality seen.
